# Patient Record
Sex: Female | Born: 1985
Health system: Southern US, Community
[De-identification: ages and names within clinical notes are randomized; demographics above are authoritative.]

## PROBLEM LIST (undated history)

## (undated) DIAGNOSIS — E119 Type 2 diabetes mellitus without complications: Secondary | ICD-10-CM

---

## 2015-01-12 ENCOUNTER — Emergency Department
Admission: EM | Admit: 2015-01-12 | Discharge: 2015-01-12 | Disposition: A | Discharge status: Home / Self Care | Payer: 59 | Source: Home / Self Care | Attending: Family Medicine | Admitting: Family Medicine

## 2015-01-12 ENCOUNTER — Encounter: Payer: Self-pay | Admitting: Emergency Medicine

## 2015-01-12 DIAGNOSIS — W57XXXA Bitten or stung by nonvenomous insect and other nonvenomous arthropods, initial encounter: Secondary | ICD-10-CM | POA: Diagnosis not present

## 2015-01-12 DIAGNOSIS — T148 Other injury of unspecified body region: Secondary | ICD-10-CM | POA: Diagnosis not present

## 2015-01-12 HISTORY — DX: Type 2 diabetes mellitus without complications: E11.9

## 2015-01-12 MED ORDER — DOXYCYCLINE HYCLATE 100 MG PO CAPS: 100.0000 mg | ORAL_CAPSULE | Freq: Two times a day (BID) | ORAL | Status: AC

## 2015-01-12 MED ORDER — DOXYCYCLINE HYCLATE 100 MG PO CAPS: 100.0000 mg | ORAL_CAPSULE | Freq: Two times a day (BID) | ORAL | Status: DC

## 2015-01-12 NOTE — ED Provider Notes (Signed)
CSN: [id number]     Arrival date & time 01/12/15  1127 History   First MD Initiated Contact with Patient 01/12/15 1130     Chief Complaint  Patient presents with  . Insect Bite    HPI Pt presents with chief complaint of insect bite Pt states that he has gotten 2-3 insect bite over past few days Areas red and pruritic No pain  No purulent drainage  No drainage No fever or chills  Baseline diabetic  Sugars mainly in 150s-190s.   Past Medical History  Diagnosis Date  . Diabetes mellitus without complication    Past Surgical History  Procedure Laterality Date  . Cesarean section     Family History  Problem Relation Age of Onset  . Hypertension Father    History  Substance Use Topics  . Smoking status: Never Smoker   . Smokeless tobacco: Not on file  . Alcohol Use: No   OB History    No data available     Review of Systems  All other systems reviewed and are negative.   Allergies  Penicillins  Home Medications   Prior to Admission medications   Medication Sig Start Date End Date Taking? Authorizing Provider  metFORMIN (GLUCOPHAGE) 500 MG tablet Take 500 mg by mouth daily with breakfast.   Yes Historical Provider, MD   BP 148/82 mmHg  Pulse 80  Temp(Src) 97.9 F (36.6 C) (Oral)  Resp 16  Ht 5\' 10"  (1.778 m)  Wt 400 lb (181.439 kg)  BMI 57.39 kg/m2  SpO2 99%  LMP 12/03/2014 (Exact Date) Physical Exam  Constitutional: She appears well-developed and well-nourished.  HENT:  Head: Normocephalic and atraumatic.  Eyes: Conjunctivae are normal. Pupils are equal, round, and reactive to light.  Neck: Neck supple.  Cardiovascular: Normal rate and regular rhythm.   Pulmonary/Chest: Effort normal and breath sounds normal.  Abdominal: Soft.  Musculoskeletal: Normal range of motion.  Neurological: She is alert.  Skin:  2-3 focal circumscribed areas of discrete erythema. No drainage. Minimal skin breakdown.      ED Course  Procedures (including critical care  time) Labs Review Labs Reviewed - No data to display  Imaging Review No results found.   MDM   1. Bug bites    Focal isolated areas.  No tenderness, drainage concerning for infection at present though lower threshold in setting of baseline DM.  Apply topical bactroban/neosporin for now Will call in ppx rx for doxy  Scheduled antihistamines Discussed infectious red flags at length.     The patient and/or caregiver has been counseled thoroughly with regard to treatment plan and/or medications prescribed including dosage, schedule, interactions, rationale for use, and possible side effects and they verbalize understanding. Diagnoses and expected course of recovery discussed and will return if not improved as expected or if the condition worsens. Patient and/or caregiver verbalized understanding.            Floydene Flock, MD 01/12/15 1325

## 2015-01-12 NOTE — ED Notes (Signed)
Reports 3 insect bites: one on upper left arm seems infected; one on left elbow and one on right foot.

## 2015-08-16 DIAGNOSIS — R030 Elevated blood-pressure reading, without diagnosis of hypertension: Secondary | ICD-10-CM | POA: Diagnosis not present

## 2015-08-16 DIAGNOSIS — E119 Type 2 diabetes mellitus without complications: Secondary | ICD-10-CM | POA: Diagnosis not present

## 2015-08-16 MED FILL — glipiZIDE ER 5 MG TB24: 5 | 90 days supply | Qty: 90 | Fill #0

## 2015-08-30 DIAGNOSIS — R05 Cough: Secondary | ICD-10-CM | POA: Diagnosis not present

## 2015-08-30 DIAGNOSIS — J069 Acute upper respiratory infection, unspecified: Secondary | ICD-10-CM | POA: Diagnosis not present

## 2015-09-09 ENCOUNTER — Ambulatory Visit: Payer: 59

## 2015-10-03 ENCOUNTER — Other Ambulatory Visit: Payer: Self-pay

## 2015-10-03 NOTE — Patient Outreach (Signed)
[facility]) Care Management  10/03/2015  Martha Blackwell 1986-08-03 [id number]   Member did not show for scheduled initial Link to Wellness appointment.  Dudley Major RN, [facility] Care Management Coordinator-Link to Wellness [facility] Care Management [phone]

## 2015-10-08 ENCOUNTER — Encounter: Payer: Self-pay | Admitting: Emergency Medicine

## 2015-10-08 ENCOUNTER — Emergency Department (INDEPENDENT_AMBULATORY_CARE_PROVIDER_SITE_OTHER)
Admission: EM | Admit: 2015-10-08 | Discharge: 2015-10-08 | Disposition: A | Discharge status: Hospice | Payer: 59 | Source: Home / Self Care | Attending: Family Medicine | Admitting: Family Medicine

## 2015-10-08 ENCOUNTER — Emergency Department (HOSPITAL_BASED_OUTPATIENT_CLINIC_OR_DEPARTMENT_OTHER)
Admission: EM | Admit: 2015-10-08 | Discharge: 2015-10-08 | Disposition: A | Discharge status: Home / Self Care | Payer: 59 | Attending: Emergency Medicine | Admitting: Emergency Medicine

## 2015-10-08 ENCOUNTER — Encounter (HOSPITAL_BASED_OUTPATIENT_CLINIC_OR_DEPARTMENT_OTHER): Payer: Self-pay | Admitting: *Deleted

## 2015-10-08 ENCOUNTER — Emergency Department (HOSPITAL_BASED_OUTPATIENT_CLINIC_OR_DEPARTMENT_OTHER): Payer: 59

## 2015-10-08 DIAGNOSIS — M1990 Unspecified osteoarthritis, unspecified site: Secondary | ICD-10-CM | POA: Insufficient documentation

## 2015-10-08 DIAGNOSIS — Z3202 Encounter for pregnancy test, result negative: Secondary | ICD-10-CM | POA: Insufficient documentation

## 2015-10-08 DIAGNOSIS — E119 Type 2 diabetes mellitus without complications: Secondary | ICD-10-CM | POA: Insufficient documentation

## 2015-10-08 DIAGNOSIS — M79604 Pain in right leg: Secondary | ICD-10-CM | POA: Diagnosis not present

## 2015-10-08 DIAGNOSIS — M7989 Other specified soft tissue disorders: Secondary | ICD-10-CM | POA: Diagnosis not present

## 2015-10-08 DIAGNOSIS — Z880 Allergy status to penicillin: Secondary | ICD-10-CM | POA: Diagnosis not present

## 2015-10-08 DIAGNOSIS — Z791 Long term (current) use of non-steroidal anti-inflammatories (NSAID): Secondary | ICD-10-CM | POA: Insufficient documentation

## 2015-10-08 DIAGNOSIS — E669 Obesity, unspecified: Secondary | ICD-10-CM | POA: Diagnosis not present

## 2015-10-08 DIAGNOSIS — Z7984 Long term (current) use of oral hypoglycemic drugs: Secondary | ICD-10-CM | POA: Diagnosis not present

## 2015-10-08 DIAGNOSIS — M25571 Pain in right ankle and joints of right foot: Secondary | ICD-10-CM | POA: Diagnosis present

## 2015-10-08 DIAGNOSIS — M79601 Pain in right arm: Secondary | ICD-10-CM

## 2015-10-08 LAB — PREGNANCY, URINE: Preg Test, Ur: NEGATIVE

## 2015-10-08 MED ORDER — DOXYCYCLINE HYCLATE 100 MG PO CAPS: 100.0000 mg | ORAL_CAPSULE | Freq: Two times a day (BID) | ORAL | Status: AC

## 2015-10-08 MED ORDER — NAPROXEN 500 MG PO TABS: 500.0000 mg | ORAL_TABLET | Freq: Two times a day (BID) | ORAL | Status: AC

## 2015-10-08 NOTE — Discharge Instructions (Signed)

## 2015-10-08 NOTE — ED Notes (Signed)
Pt c/o rt ankle pain and swelling that began on Thursday, pt denies any mechanism of injury. CMS intact

## 2015-10-08 NOTE — Discharge Instructions (Signed)

## 2015-10-08 NOTE — ED Notes (Signed)
PT C/O RIGHT ANKLE SWELLING X 3 DAYS, NO APPARENT INJURY

## 2015-10-08 NOTE — ED Provider Notes (Signed)
CSN: [id number]     Arrival date & time 10/08/15  1444 History   First MD Initiated Contact with Patient 10/08/15 1535     Chief Complaint  Patient presents with  . Joint Swelling      HPI Comments: Four days ago patient developed soreness in her right foot.  The pain has now increased to include swelling/pain in her right foot, ankle, and distal lower leg.  She has pain with weight-bearing.  She recalls no injury or change in physical activities.  No chest pain or shortness of breath.  No past history of DVT. Her pain has not responded to "[employer] and Aleve.  Patient is a 30 y.o. female presenting with leg pain. The history is provided by the patient.  Leg Pain Location:  Leg, ankle and foot Time since incident:  4 days Injury: no   Leg location:  R lower leg Ankle location:  R ankle Foot location:  R foot Pain details:    Quality:  Aching   Radiates to:  Does not radiate   Severity:  Moderate   Onset quality:  Gradual   Duration:  4 days   Timing:  Constant   Progression:  Worsening Chronicity:  New Prior injury to area:  No Relieved by:  Nothing Worsened by:  Bearing weight Ineffective treatments:  NSAIDs Associated symptoms: decreased ROM, stiffness and swelling   Associated symptoms: no fatigue, no fever, no muscle weakness, no numbness and no tingling   Risk factors: obesity     Past Medical History  Diagnosis Date  . Diabetes mellitus without complication [facility])    Past Surgical History  Procedure Laterality Date  . Cesarean section     Family History  Problem Relation Age of Onset  . Hypertension Father    Social History  Substance Use Topics  . Smoking status: Never Smoker   . Smokeless tobacco: None  . Alcohol Use: No   OB History    No data available     Review of Systems  Constitutional: Negative for fever and fatigue.  Respiratory: Negative for cough, chest tightness, shortness of breath and wheezing.   Cardiovascular: Positive for leg  swelling. Negative for chest pain.  Musculoskeletal: Positive for stiffness.  All other systems reviewed and are negative.   Allergies  Penicillins and Tuna  Home Medications   Prior to Admission medications   Medication Sig Start Date End Date Taking? Authorizing Provider  glipiZIDE (GLUCOTROL) 5 MG tablet Take by mouth daily before breakfast.   Yes Historical Provider, MD   Meds Ordered and Administered this Visit  Medications - No data to display  BP 141/81 mmHg  Pulse 86  Temp(Src) 98.3 F (36.8 C)  Ht  (1.778 m)  Wt 407 lb 12 oz (184.954 kg)  BMI 58.51 kg/m2  SpO2 98%  LMP 09/04/2015 No data found.   Physical Exam  Constitutional: She is oriented to person, place, and time.  Patient is obese (BMI 58.5)  HENT:  Head: Normocephalic.  Eyes: Pupils are equal, round, and reactive to light.  Pulmonary/Chest: No respiratory distress.  Musculoskeletal:       Right lower leg: She exhibits tenderness and swelling. She exhibits no bony tenderness.       Legs: Right ankle and foot are slightly warm, swollen, and tender to palpation.  Tenderness extends to right distal calf.    Pedal pulses intact.  Neurological: She is alert and oriented to person, place, and time.  Skin:  Skin is warm and dry. No rash noted.  Nursing note and vitals reviewed.   ED Course  Procedures none    MDM   1. Leg pain, inferior, right; Concern for possible DVT   [facility] for further evaluation and  LE venous US to R/O DVT  Lattie HawStephen A Ashwini Jago, MD 10/08/15 [phone]

## 2015-10-08 NOTE — ED Provider Notes (Signed)
CSN: [id number]     Arrival date & time 10/08/15  1608 History  By signing my name below, I, Tanda RockersMargaux Venter, attest that this documentation has been prepared under the direction and in the presence of Rolan BuccoMelanie Jewell Ryans, MD. Electronically Signed: Tanda RockersMargaux Venter, ED Scribe. 10/08/2015. 4:52 PM.   Chief Complaint  Patient presents with  . Ankle Pain   The history is provided by the patient. No language interpreter was used.     HPI Comments: Martha Blackwell is a 30 y.o. female who presents to the Emergency Department complaining of right ankle pain radiating up to right lower leg x 4 days. No known injury or wounds to the ankle. Pt also complains of swelling to the area. She has been taking Aleve with some relief.  [place] and advised to come to the ED for further evaluation to rule out DVT. Denies fever, chest pain, shortness of breath, or any other associated symptoms.   Past Medical History  Diagnosis Date  . Diabetes mellitus without complication [facility](HCC)    Past Surgical History  Procedure Laterality Date  . Cesarean section     Family History  Problem Relation Age of Onset  . Hypertension Father    Social History  Substance Use Topics  . Smoking status: Never Smoker   . Smokeless tobacco: None  . Alcohol Use: No   OB History    No data available     Review of Systems  Constitutional: Negative for fever, chills, diaphoresis and fatigue.  HENT: Negative for congestion, rhinorrhea and sneezing.   Eyes: Negative.   Respiratory: Negative for cough, chest tightness and shortness of breath.   Cardiovascular: Negative for chest pain and leg swelling.  Gastrointestinal: Negative for nausea, vomiting, abdominal pain, diarrhea and blood in stool.  Genitourinary: Negative for frequency, hematuria, flank pain and difficulty urinating.  Musculoskeletal: Positive for joint swelling and arthralgias. Negative for back pain.  Skin: Negative for  rash and wound.  Neurological: Negative for dizziness, speech difficulty, weakness, numbness and headaches.   Allergies  Penicillins and Tuna  Home Medications   Prior to Admission medications   Medication Sig Start Date End Date Taking? Authorizing Provider  doxycycline (VIBRAMYCIN) 100 MG capsule Take 1 capsule (100 mg total) by mouth 2 (two) times daily. One po bid x 7 days 10/08/15   Rolan BuccoMelanie Saumya Hukill, MD  glipiZIDE (GLUCOTROL) 5 MG tablet Take by mouth daily before breakfast.    Historical Provider, MD  naproxen (NAPROSYN) 500 MG tablet Take 1 tablet (500 mg total) by mouth 2 (two) times daily. 10/08/15   Rolan BuccoMelanie Kingsly Kloepfer, MD   BP 142/93 mmHg  Pulse 82  Temp(Src) 98.4 F (36.9 C) (Oral)  Resp 18  Ht 5\' 10"  (1.778 m)  Wt 407 lb (184.614 kg)  BMI 58.40 kg/m2  SpO2 100%  LMP 09/04/2015   Physical Exam  Constitutional: She is oriented to person, place, and time. She appears well-developed and well-nourished.  Obese  HENT:  Head: Normocephalic and atraumatic.  Eyes: Pupils are equal, round, and reactive to light.  Neck: Normal range of motion. Neck supple.  Cardiovascular: Normal rate, regular rhythm and normal heart sounds.   Pulmonary/Chest: Effort normal and breath sounds normal. No respiratory distress. She has no wheezes. She has no rales. She exhibits no tenderness.  Abdominal: Soft. Bowel sounds are normal. There is no tenderness. There is no rebound and no guarding.  Musculoskeletal: Normal range of motion.  She exhibits no edema.  Patient has swelling to the right lower leg from the mid tib-fib area across the ankle and through the dorsum of the foot. There is generalized tenderness on palpation of the area. There is no significant pain on range of motion of the joint which would be more indicative of septic arthritis. There some mild erythema and warmth on the lateral aspect of the ankle. No wounds are noted. She's neurovascularly intact.  Lymphadenopathy:    She has no cervical  adenopathy.  Neurological: She is alert and oriented to person, place, and time.  Skin: Skin is warm and dry. No rash noted.  Psychiatric: She has a normal mood and affect.    ED Course  Procedures (including critical care time)  DIAGNOSTIC STUDIES: Oxygen Saturation is 100% on RA, normal by my interpretation.    COORDINATION OF CARE: 4:51 PM-Discussed treatment plan which includes [place] lower extremity with pt at bedside and pt agreed to plan.   Labs Review Labs Reviewed  PREGNANCY, URINE    Imaging Review Dg Ankle Complete Right  10/08/2015  CLINICAL DATA:  Right ankle pain and swelling for 3 days. No known injury. Diabetes. EXAM: RIGHT ANKLE - COMPLETE 3+ VIEW COMPARISON:  None. FINDINGS: There is no evidence of fracture, dislocation, or joint effusion. Mild degenerative spurring is seen without significant joint space narrowing. No other bone lesions identified. Diffuse soft tissue swelling noted. IMPRESSION: Diffuse ankle soft tissue swelling. No evidence of fracture or dislocation. Mild ankle DJD. Electronically Signed   By: Myles Rosenthal M.D.   On: 10/08/2015 17:54   US Venous Img Lower Unilateral Right  10/08/2015  CLINICAL DATA:  Right leg pain and swelling for 4 days EXAM: RIGHT LOWER EXTREMITY VENOUS DOPPLER ULTRASOUND TECHNIQUE: Gray-scale sonography with graded compression, as well as color Doppler and duplex ultrasound were performed to evaluate the lower extremity deep venous systems from the level of the common femoral vein and including the common femoral, femoral, profunda femoral, popliteal and calf veins including the posterior tibial, peroneal and gastrocnemius veins when visible. The superficial great saphenous vein was also interrogated. Spectral Doppler was utilized to evaluate flow at rest and with distal augmentation maneuvers in the common femoral, femoral and popliteal veins. COMPARISON:  None. FINDINGS: Technically suboptimal exam due to morbid obesity. Contralateral  Common Femoral Vein: Respiratory phasicity is normal and symmetric with the symptomatic side. No evidence of thrombus. Normal compressibility. Common Femoral Vein: No evidence of thrombus. Normal compressibility, respiratory phasicity and response to augmentation. Saphenofemoral Junction: No evidence of thrombus. Normal compressibility and flow on color Doppler imaging. Profunda Femoral Vein: No evidence of thrombus. Normal compressibility and flow on color Doppler imaging. Femoral Vein: No evidence of thrombus. Normal compressibility, respiratory phasicity and response to augmentation. Popliteal Vein: No evidence of thrombus. Normal compressibility, respiratory phasicity and response to augmentation. Calf Veins: No evidence of thrombus. Normal compressibility and flow on color Doppler imaging. Superficial Great Saphenous Vein: No evidence of thrombus. Normal compressibility and flow on color Doppler imaging. Venous Reflux:  None. Other Findings: Soft tissue edema noted in right lower leg/ankle region. IMPRESSION: No definite evidence of acute deep venous thrombosis. Electronically Signed   By: Myles Rosenthal M.D.   On: 10/08/2015 18:47   I have personally reviewed and evaluated these images as part of my medical decision-making.   EKG Interpretation None      MDM   Final diagnoses:  Arthritis   Patient has diffuse swelling of the lower  leg from the mid tib-fib area through the foot. There is no evidence of DVT. There is no bony abnormality. She denies any recent traumatic event. There is no suggestions of a septic joint. She does have some mild erythema and warmth laterally but the rest of the area does not appear to be warm or red. I feel this is more a generalized swelling related to tendinitis/arthritis. She doesn't have a history or family history of gout. She has no other history of joint diseases. Given that she is a diabetic and there is some mild warmth and erythema, I will go ahead and treat her  with antibiotics. She was given prescription for doxycycline and Naprosyn. She was encouraged to keep her leg elevated as much as possible. She was encouraged to follow-up with her PCP within the next few days for recheck or return here as needed for any worsening symptoms. She states her last menstrual cycle was the end of January. Her pregnancy test is negative.  I personally performed the services described in this documentation, which was scribed in my presence.  The recorded information has been reviewed and considered.      Rolan Bucco, MD 10/08/15 2014

## 2015-10-09 MED FILL — DOXYCYCLINE HYC 100 MG CAP: 100 | 7 days supply | Qty: 14 | Fill #0

## 2015-10-09 MED FILL — NAPROXEN 500 MG TABLET: 500 | 15 days supply | Qty: 30 | Fill #0

## 2015-11-21 DIAGNOSIS — I10 Essential (primary) hypertension: Secondary | ICD-10-CM | POA: Diagnosis not present

## 2015-11-21 DIAGNOSIS — E119 Type 2 diabetes mellitus without complications: Secondary | ICD-10-CM | POA: Diagnosis not present

## 2015-11-21 DIAGNOSIS — R635 Abnormal weight gain: Secondary | ICD-10-CM | POA: Diagnosis not present

## 2015-12-08 DIAGNOSIS — Z6841 Body Mass Index (BMI) 40.0 and over, adult: Secondary | ICD-10-CM | POA: Diagnosis not present

## 2015-12-08 DIAGNOSIS — E119 Type 2 diabetes mellitus without complications: Secondary | ICD-10-CM | POA: Diagnosis not present

## 2015-12-22 DIAGNOSIS — Z6841 Body Mass Index (BMI) 40.0 and over, adult: Secondary | ICD-10-CM | POA: Diagnosis not present

## 2015-12-27 DIAGNOSIS — Z01419 Encounter for gynecological examination (general) (routine) without abnormal findings: Secondary | ICD-10-CM | POA: Diagnosis not present

## 2015-12-27 MED FILL — MEDROXYPROGESTERONE 10 MG T: 10 | 30 days supply | Qty: 30 | Fill #0

## 2016-01-26 DIAGNOSIS — Z6841 Body Mass Index (BMI) 40.0 and over, adult: Secondary | ICD-10-CM | POA: Diagnosis not present

## 2016-01-26 DIAGNOSIS — E119 Type 2 diabetes mellitus without complications: Secondary | ICD-10-CM | POA: Diagnosis not present

## 2016-02-16 DIAGNOSIS — F54 Psychological and behavioral factors associated with disorders or diseases classified elsewhere: Secondary | ICD-10-CM | POA: Diagnosis not present

## 2016-02-16 DIAGNOSIS — Z7189 Other specified counseling: Secondary | ICD-10-CM | POA: Diagnosis not present

## 2016-02-16 DIAGNOSIS — Z6841 Body Mass Index (BMI) 40.0 and over, adult: Secondary | ICD-10-CM | POA: Diagnosis not present

## 2016-03-04 DIAGNOSIS — E119 Type 2 diabetes mellitus without complications: Secondary | ICD-10-CM | POA: Diagnosis not present

## 2016-03-04 DIAGNOSIS — Z6841 Body Mass Index (BMI) 40.0 and over, adult: Secondary | ICD-10-CM | POA: Diagnosis not present

## 2016-03-04 MED FILL — glipiZIDE XL 5 MG TB24: 5 | 90 days supply | Qty: 90 | Fill #0

## 2016-03-08 DIAGNOSIS — Z6841 Body Mass Index (BMI) 40.0 and over, adult: Secondary | ICD-10-CM | POA: Diagnosis not present

## 2016-03-08 DIAGNOSIS — E119 Type 2 diabetes mellitus without complications: Secondary | ICD-10-CM | POA: Diagnosis not present

## 2016-03-26 MED FILL — PHENTERMINE 37.5 MG TABLET: 37.5 | 30 days supply | Qty: 30 | Fill #0

## 2016-05-06 MED FILL — PHENTERMINE 37.5 MG TABLET: 37.5 | 30 days supply | Qty: 30 | Fill #1

## 2016-06-11 MED FILL — PHENTERMINE 37.5 MG TABLET: 37.5 | 30 days supply | Qty: 30 | Fill #0

## 2016-06-21 DIAGNOSIS — I10 Essential (primary) hypertension: Secondary | ICD-10-CM | POA: Diagnosis not present

## 2016-06-21 DIAGNOSIS — E119 Type 2 diabetes mellitus without complications: Secondary | ICD-10-CM | POA: Diagnosis not present

## 2016-06-21 DIAGNOSIS — Z6841 Body Mass Index (BMI) 40.0 and over, adult: Secondary | ICD-10-CM | POA: Diagnosis not present

## 2016-07-23 DIAGNOSIS — Z6841 Body Mass Index (BMI) 40.0 and over, adult: Secondary | ICD-10-CM | POA: Diagnosis not present

## 2016-07-23 DIAGNOSIS — E785 Hyperlipidemia, unspecified: Secondary | ICD-10-CM | POA: Diagnosis not present

## 2016-07-23 DIAGNOSIS — E1169 Type 2 diabetes mellitus with other specified complication: Secondary | ICD-10-CM | POA: Diagnosis not present

## 2016-08-23 MED FILL — glipiZIDE XL 5 MG TB24: 5 | 90 days supply | Qty: 90 | Fill #0

## 2016-09-03 MED FILL — PHENTERMINE 37.5 MG TABLET: 37.5 | 30 days supply | Qty: 30 | Fill #0

## 2016-10-24 DIAGNOSIS — Z6841 Body Mass Index (BMI) 40.0 and over, adult: Secondary | ICD-10-CM | POA: Diagnosis not present

## 2016-10-24 DIAGNOSIS — E785 Hyperlipidemia, unspecified: Secondary | ICD-10-CM | POA: Diagnosis not present

## 2016-10-24 DIAGNOSIS — G43009 Migraine without aura, not intractable, without status migrainosus: Secondary | ICD-10-CM | POA: Diagnosis not present

## 2016-10-24 DIAGNOSIS — E1169 Type 2 diabetes mellitus with other specified complication: Secondary | ICD-10-CM | POA: Diagnosis not present

## 2016-10-25 DIAGNOSIS — Z6841 Body Mass Index (BMI) 40.0 and over, adult: Secondary | ICD-10-CM | POA: Diagnosis not present

## 2016-10-25 DIAGNOSIS — E119 Type 2 diabetes mellitus without complications: Secondary | ICD-10-CM | POA: Diagnosis not present

## 2016-11-04 MED FILL — BELVIQ 10 MG TABLET: 10 | 30 days supply | Qty: 60 | Fill #0

## 2016-12-06 DIAGNOSIS — L700 Acne vulgaris: Secondary | ICD-10-CM | POA: Diagnosis not present

## 2016-12-09 MED FILL — ACCU-CHEK FASTCLIX LANCETS: 90 days supply | Qty: 102 | Fill #0

## 2016-12-09 MED FILL — ACCU-CHEK GUIDE TEST STRIP: 90 days supply | Qty: 100 | Fill #0

## 2017-01-03 MED FILL — glipiZIDE XL 5 MG TB24: 5 | 90 days supply | Qty: 90 | Fill #0

## 2017-01-21 DIAGNOSIS — E669 Obesity, unspecified: Secondary | ICD-10-CM | POA: Diagnosis not present

## 2017-01-21 DIAGNOSIS — D649 Anemia, unspecified: Secondary | ICD-10-CM | POA: Diagnosis not present

## 2017-01-21 DIAGNOSIS — E1169 Type 2 diabetes mellitus with other specified complication: Secondary | ICD-10-CM | POA: Diagnosis not present

## 2017-01-31 DIAGNOSIS — E119 Type 2 diabetes mellitus without complications: Secondary | ICD-10-CM | POA: Diagnosis not present

## 2017-01-31 DIAGNOSIS — Z6841 Body Mass Index (BMI) 40.0 and over, adult: Secondary | ICD-10-CM | POA: Diagnosis not present

## 2017-01-31 MED FILL — QSYMIA 3.75 MG-23 MG CAP: 3.75-23 | 14 days supply | Qty: 14 | Fill #0

## 2017-02-28 MED FILL — QSYMIA 7.5 MG-46 MG CAPSULE: 7.5-46 | 30 days supply | Qty: 30 | Fill #0

## 2017-04-11 DIAGNOSIS — Z6841 Body Mass Index (BMI) 40.0 and over, adult: Secondary | ICD-10-CM | POA: Diagnosis not present

## 2017-04-11 DIAGNOSIS — E119 Type 2 diabetes mellitus without complications: Secondary | ICD-10-CM | POA: Diagnosis not present

## 2017-04-21 DIAGNOSIS — E1169 Type 2 diabetes mellitus with other specified complication: Secondary | ICD-10-CM | POA: Diagnosis not present

## 2017-04-21 DIAGNOSIS — E669 Obesity, unspecified: Secondary | ICD-10-CM | POA: Diagnosis not present

## 2017-04-21 DIAGNOSIS — D649 Anemia, unspecified: Secondary | ICD-10-CM | POA: Diagnosis not present

## 2017-05-06 MED FILL — glipiZIDE ER 5 MG TB24: 5 | 90 days supply | Qty: 90 | Fill #0

## 2017-05-06 MED FILL — ACCU-CHEK GUIDE TEST STRIP: 90 days supply | Qty: 100 | Fill #0

## 2017-06-20 DIAGNOSIS — E559 Vitamin D deficiency, unspecified: Secondary | ICD-10-CM | POA: Diagnosis not present

## 2017-06-20 DIAGNOSIS — Z6841 Body Mass Index (BMI) 40.0 and over, adult: Secondary | ICD-10-CM | POA: Diagnosis not present

## 2017-06-20 DIAGNOSIS — Z136 Encounter for screening for cardiovascular disorders: Secondary | ICD-10-CM | POA: Diagnosis not present

## 2017-06-20 DIAGNOSIS — E119 Type 2 diabetes mellitus without complications: Secondary | ICD-10-CM | POA: Diagnosis not present

## 2017-06-20 DIAGNOSIS — R5383 Other fatigue: Secondary | ICD-10-CM | POA: Diagnosis not present

## 2017-08-24 IMAGING — DX DG ANKLE COMPLETE 3+V*R*
3 series · 3 of 3 positions shown · non-contrast
Comparison: None.

CLINICAL DATA: Right ankle pain and swelling for 3 days. No known
injury. Diabetes.

EXAM:
RIGHT ANKLE - COMPLETE 3+ VIEW

[ankle obl]
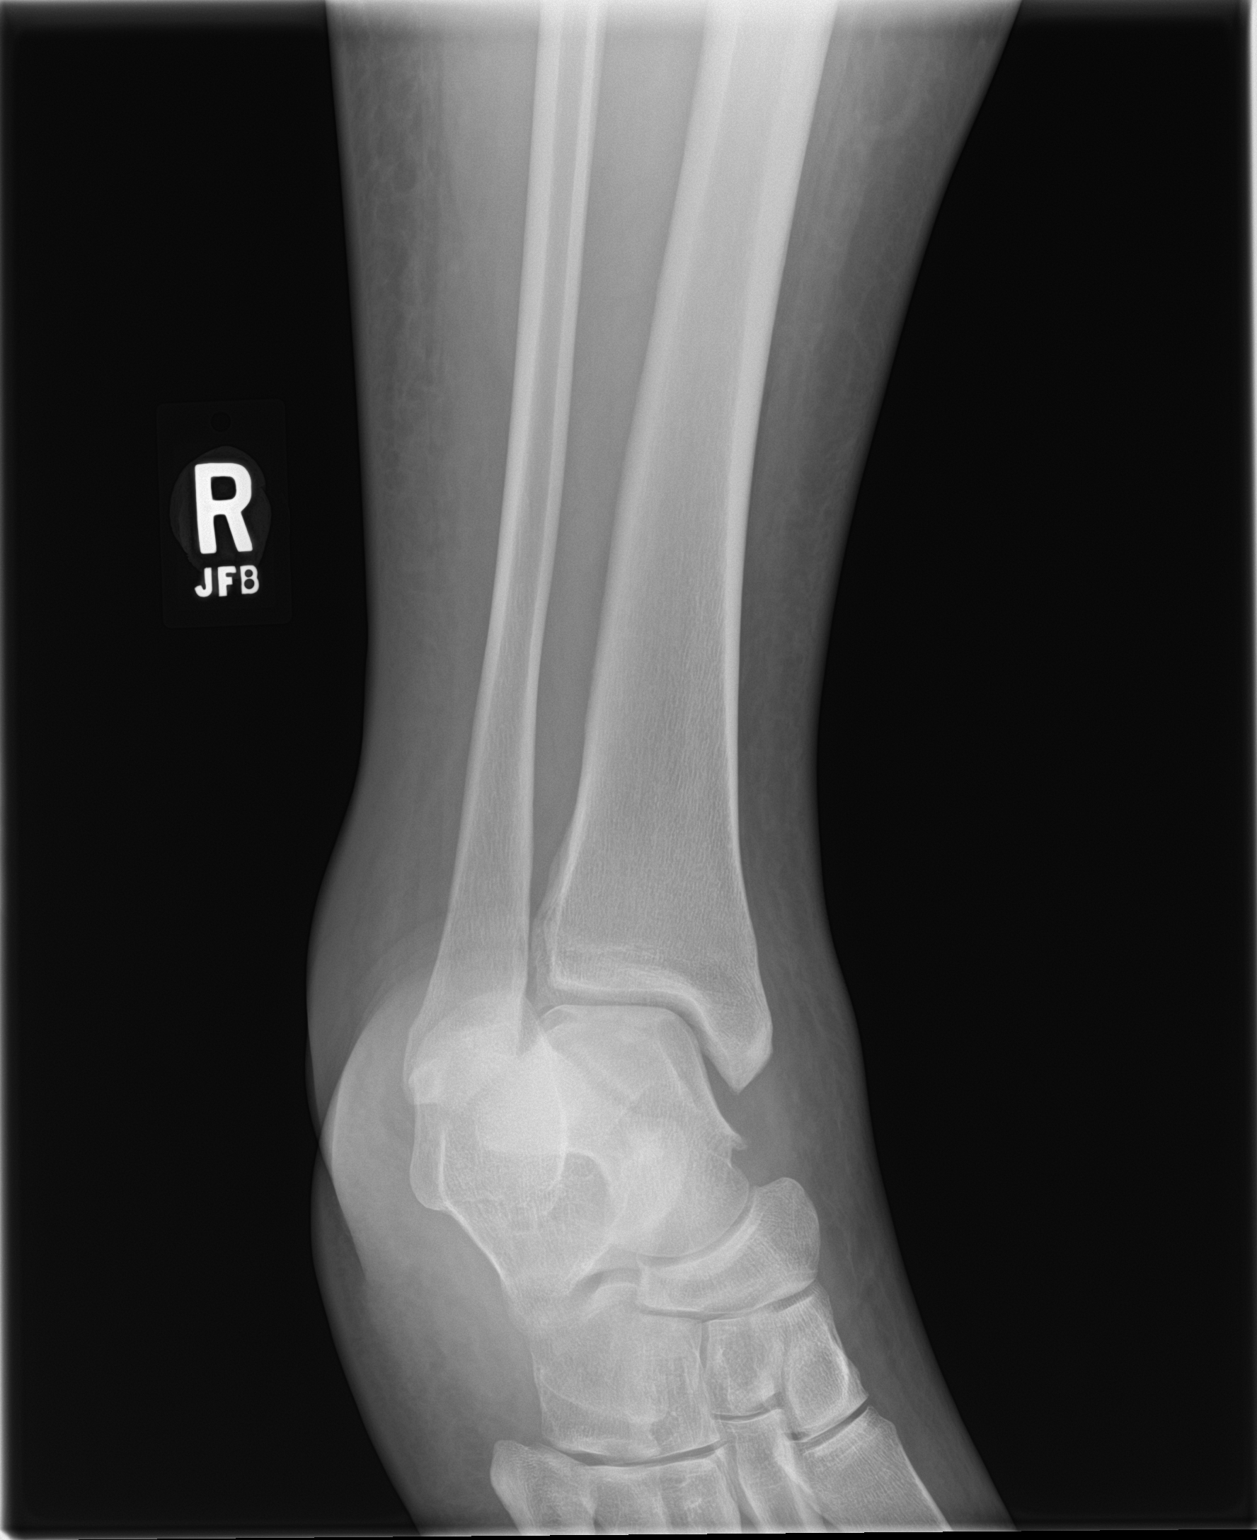

[ankle lat]
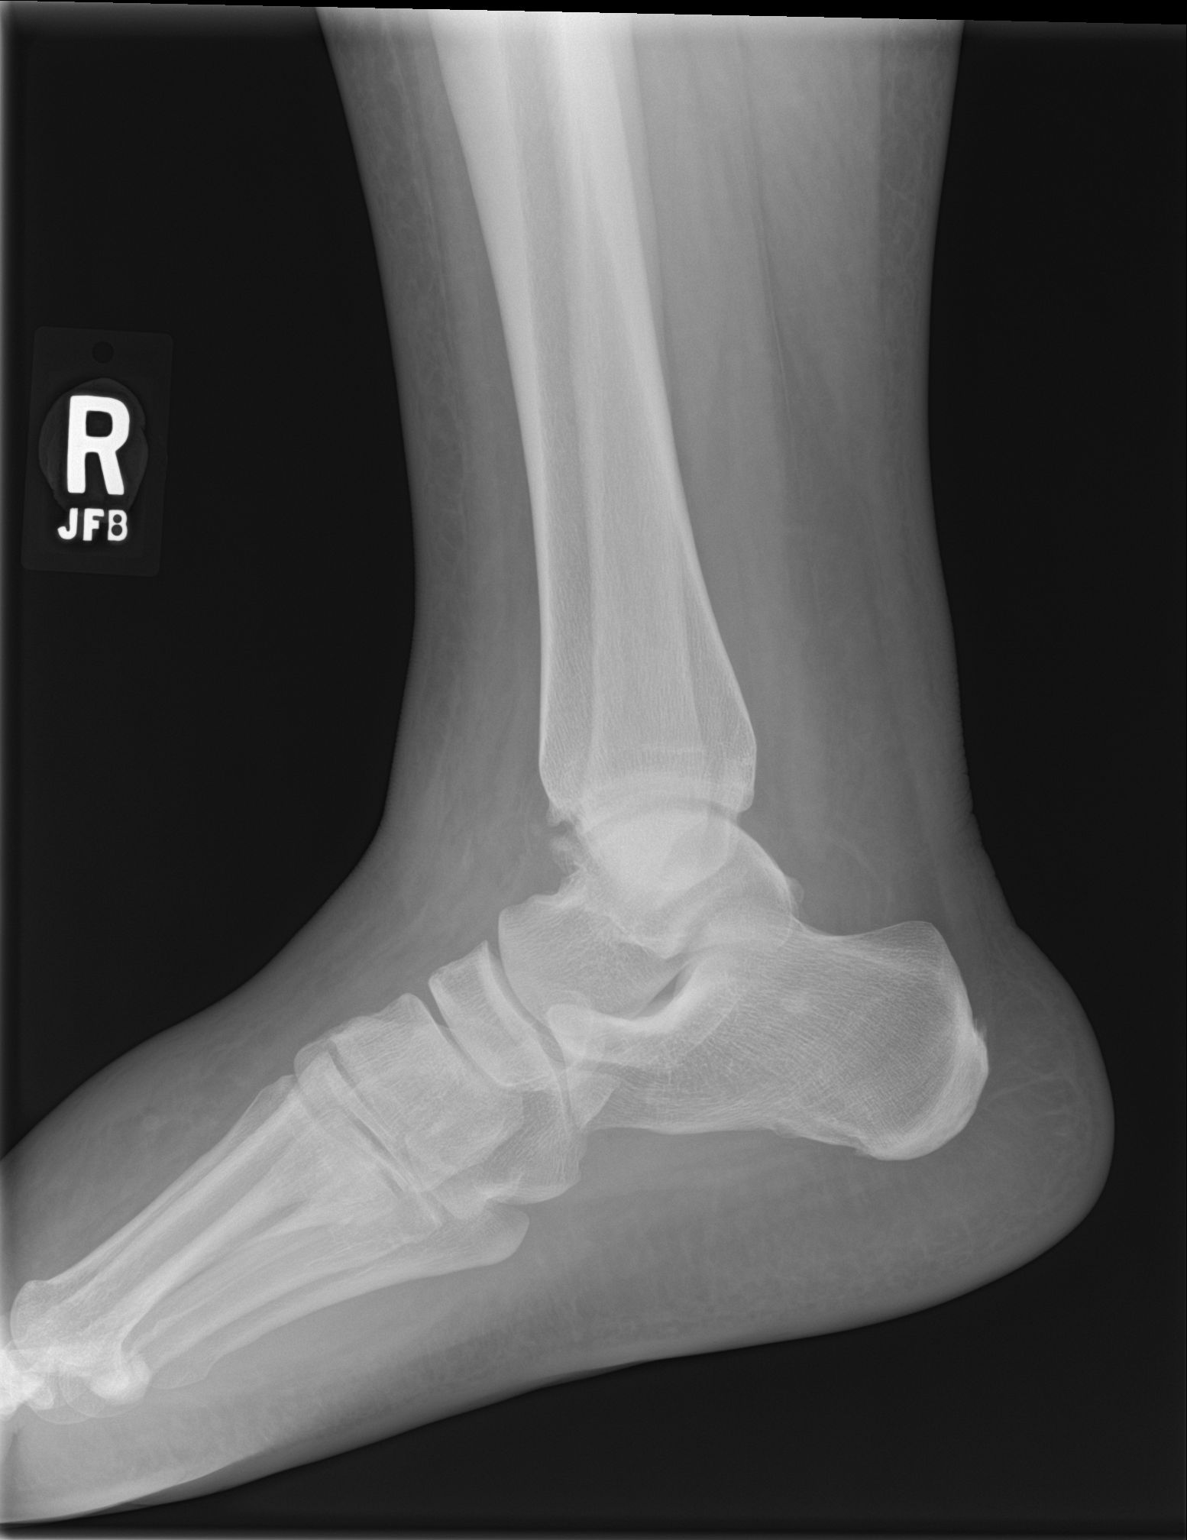

[ankle ap]
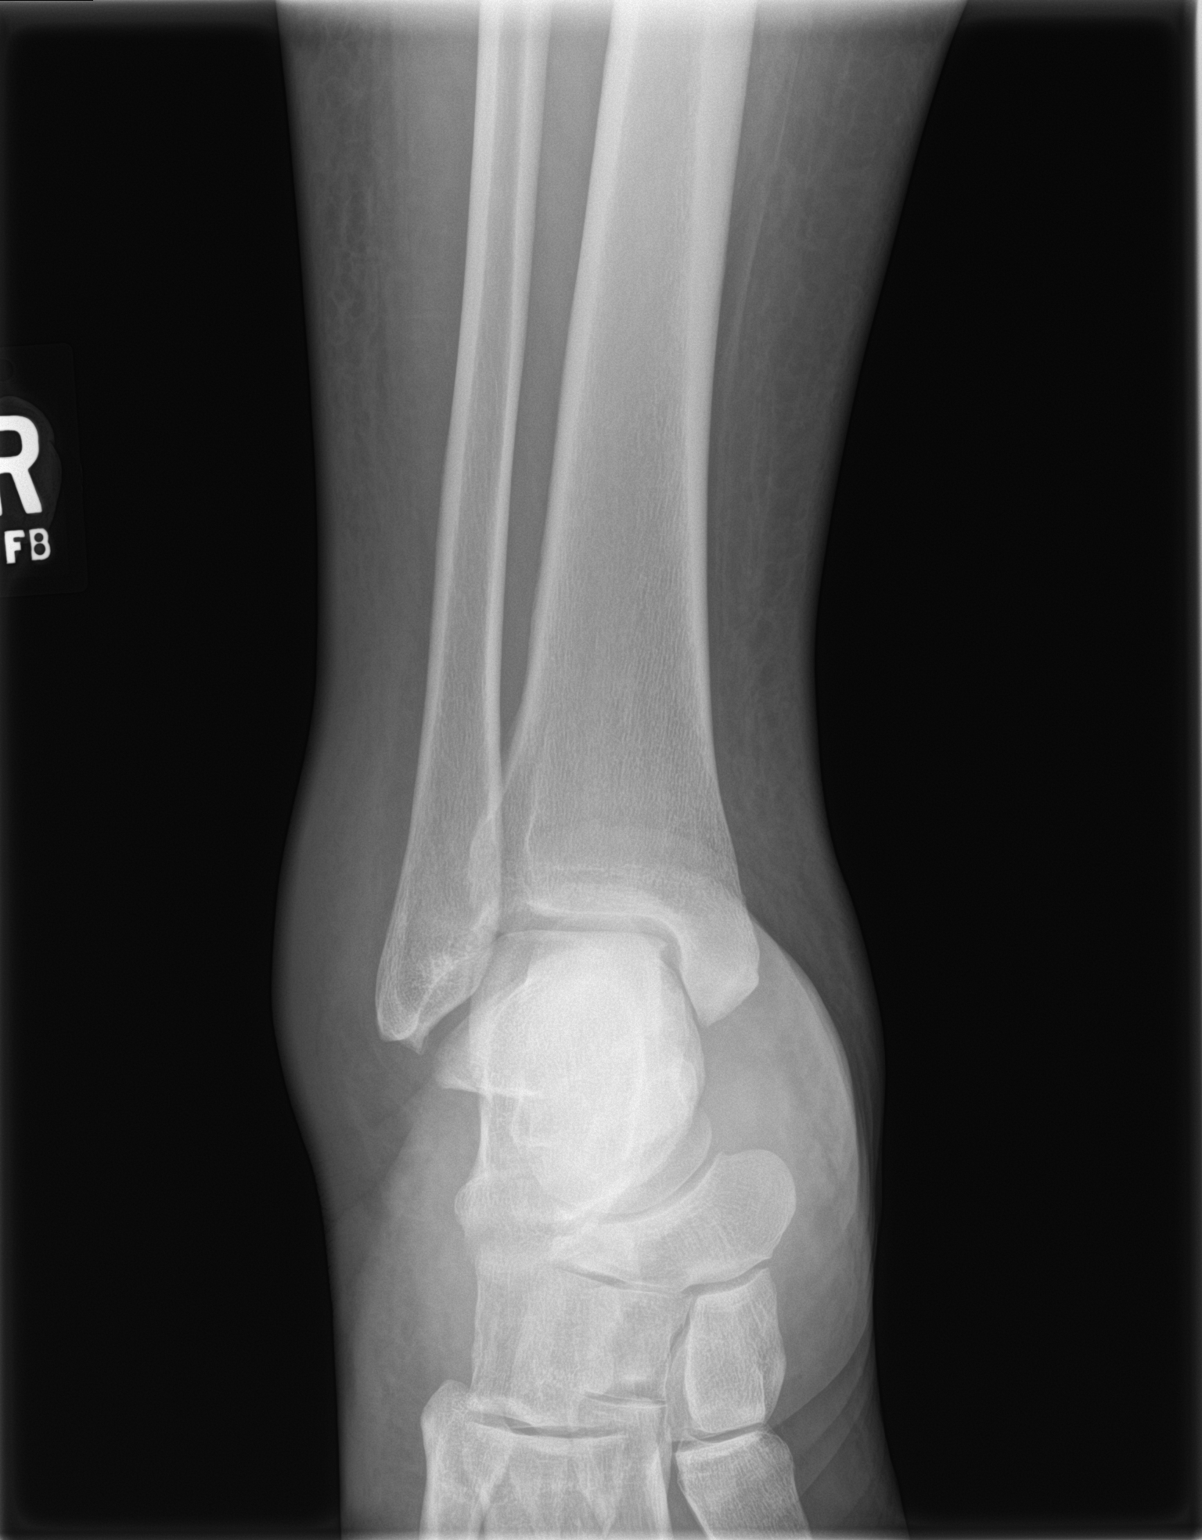

[3 of 3 positions shown; findings below may reference images not displayed]

FINDINGS: There is no evidence of fracture, dislocation, or joint effusion.
Mild degenerative spurring is seen without significant joint space
narrowing. No other bone lesions identified. Diffuse soft tissue
swelling noted.
IMPRESSION: Diffuse ankle soft tissue swelling. No evidence of fracture or
dislocation.

Mild ankle DJD.

## 2018-02-04 IMAGING — US US EXTREM LOW VENOUS*R*
1 series · 13 of 24 positions shown · non-contrast
Comparison: None.

CLINICAL DATA: Right leg pain and swelling for 4 days



[Series 1: us extrem low venous*right* · 0.10mm/px · 13 of 24 slices shown]
[im 1/24]
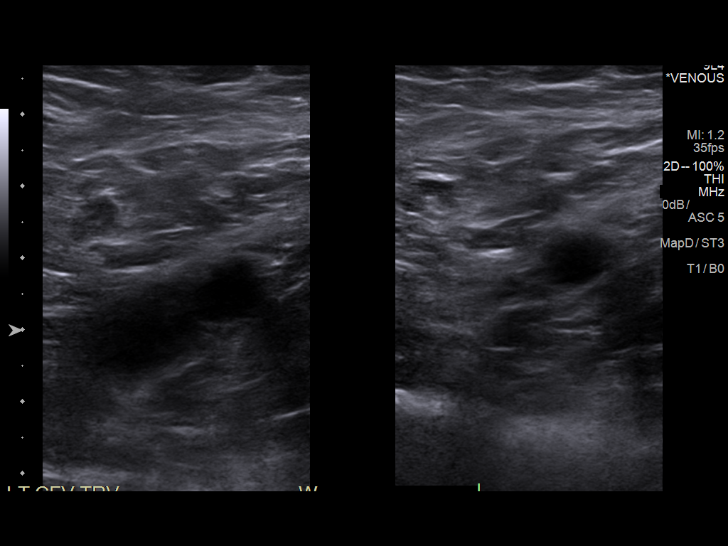
[im 3/24]
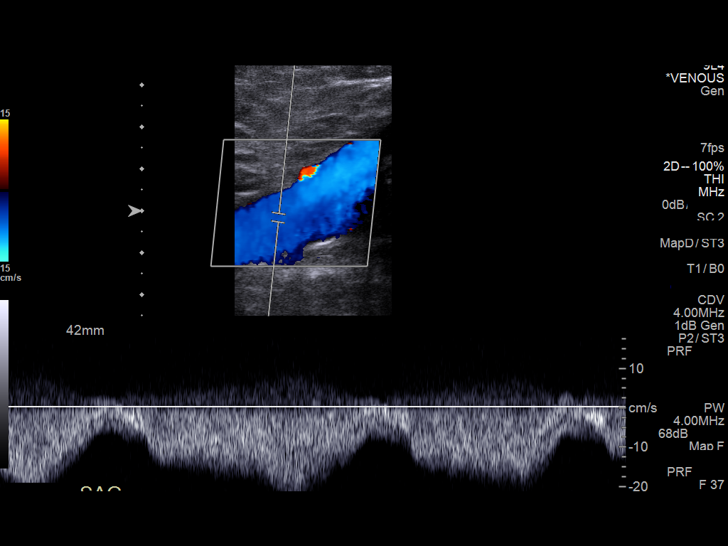
[im 5/24]
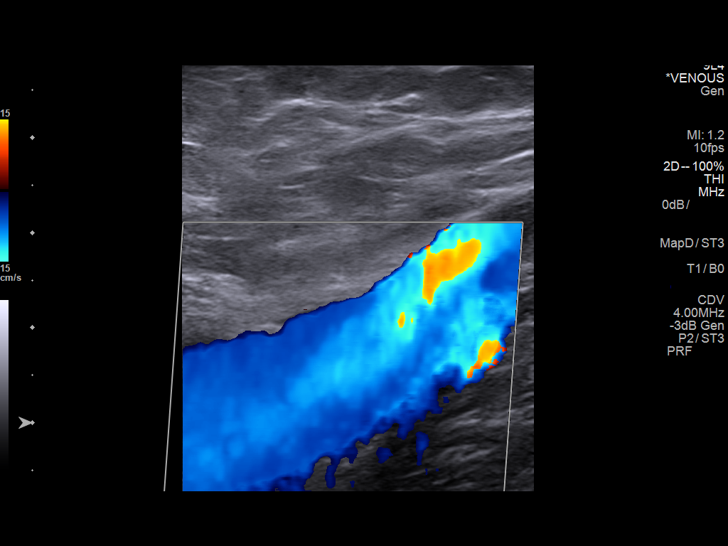
[im 7/24]
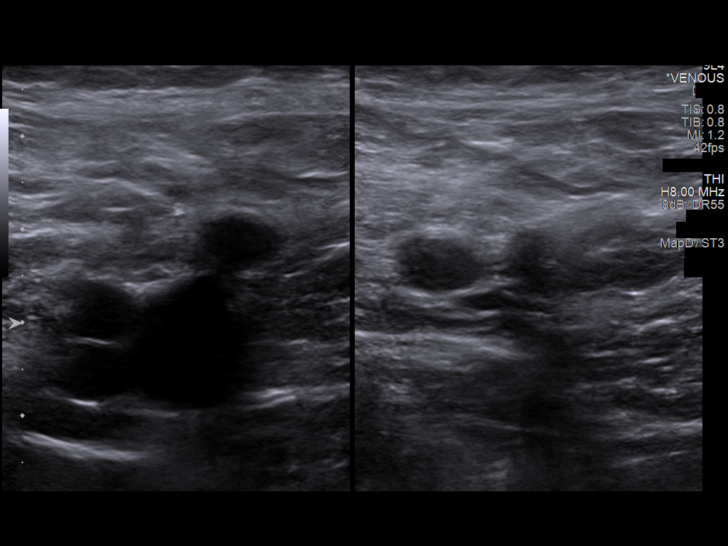
[im 9/24]
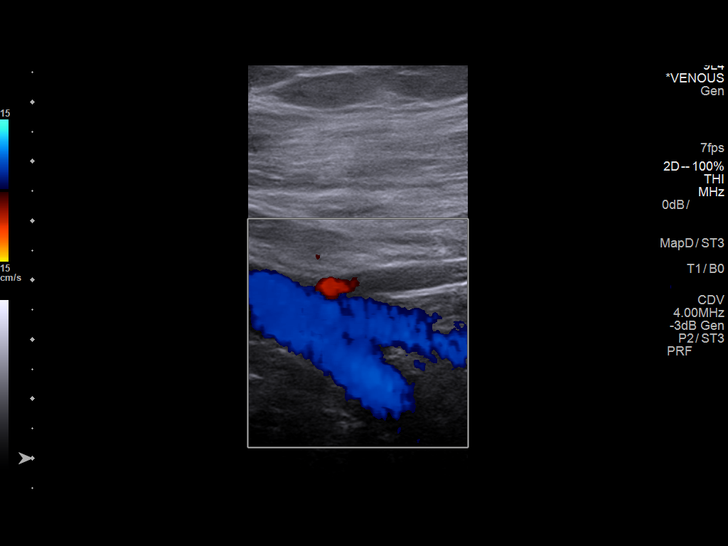
[im 11/24]
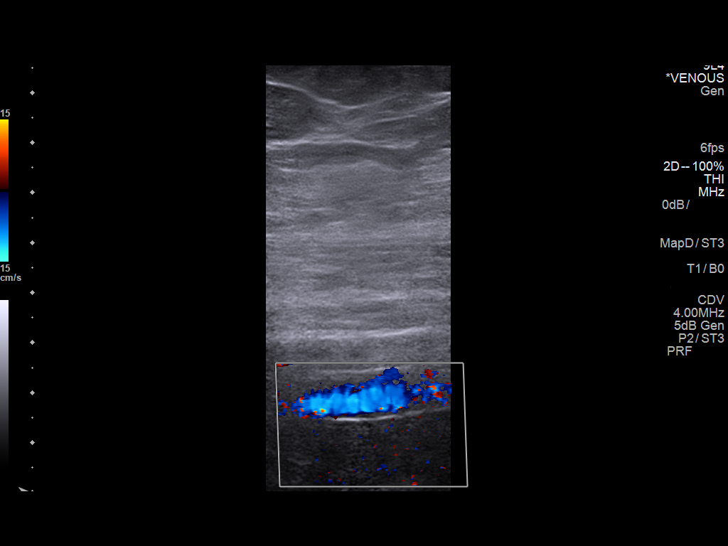
[im 13/24]
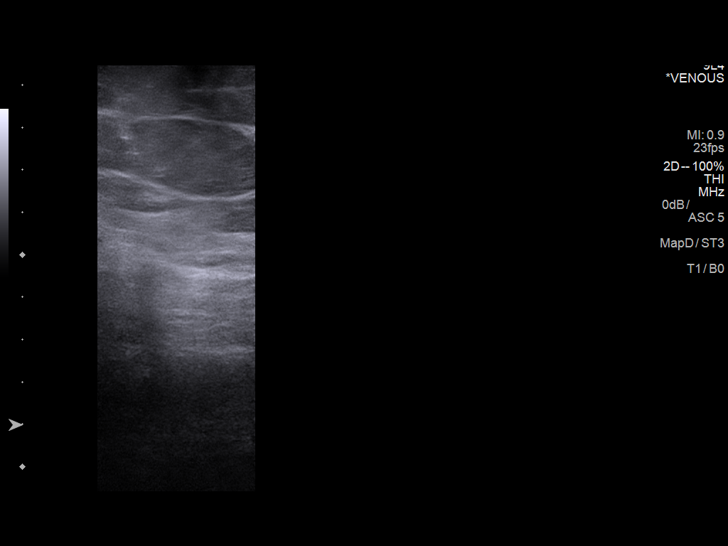
[im 14/24]
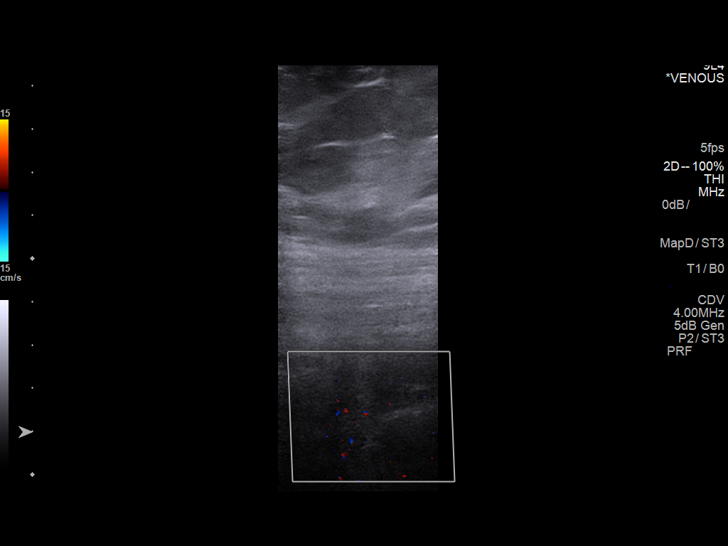
[im 16/24]
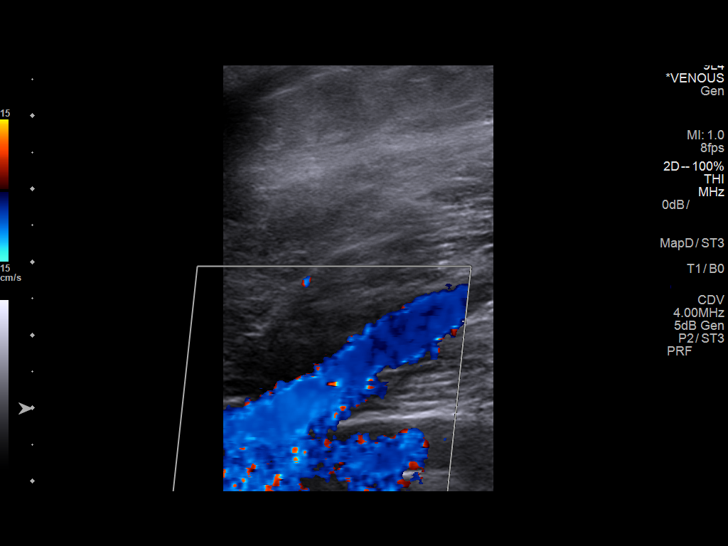
[im 18/24]
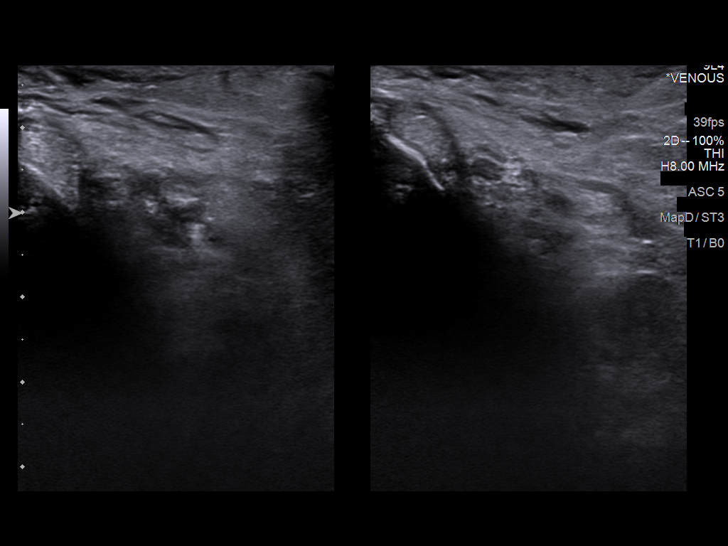
[im 20/24]
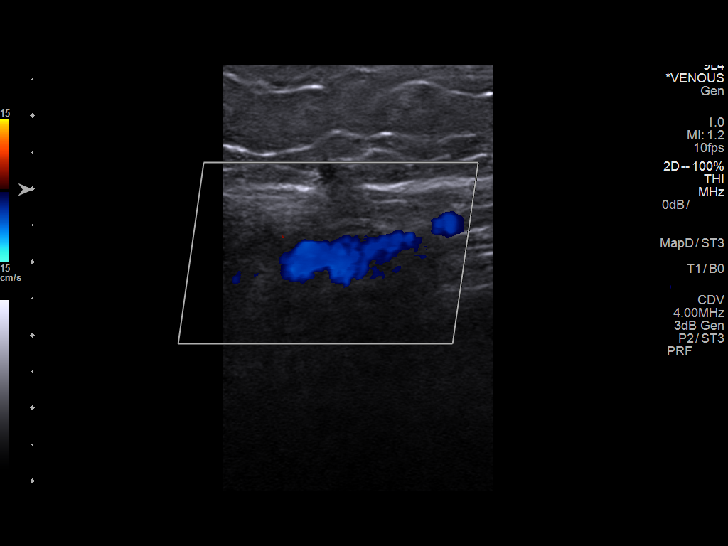
[im 22/24]
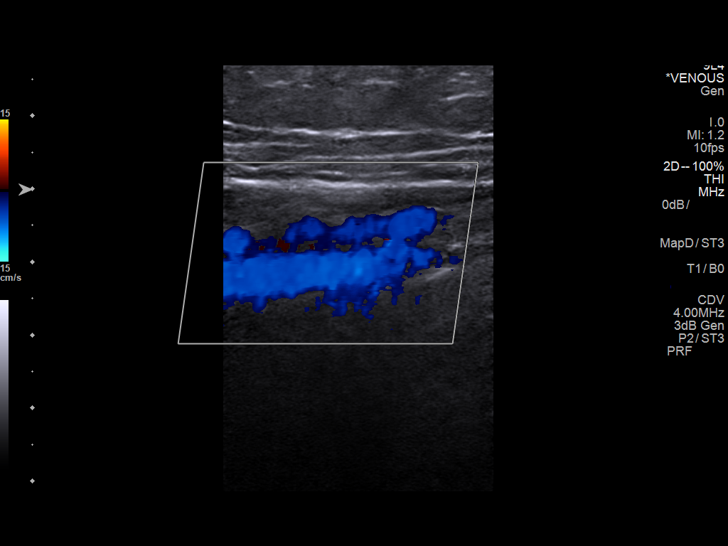
[im 24/24]
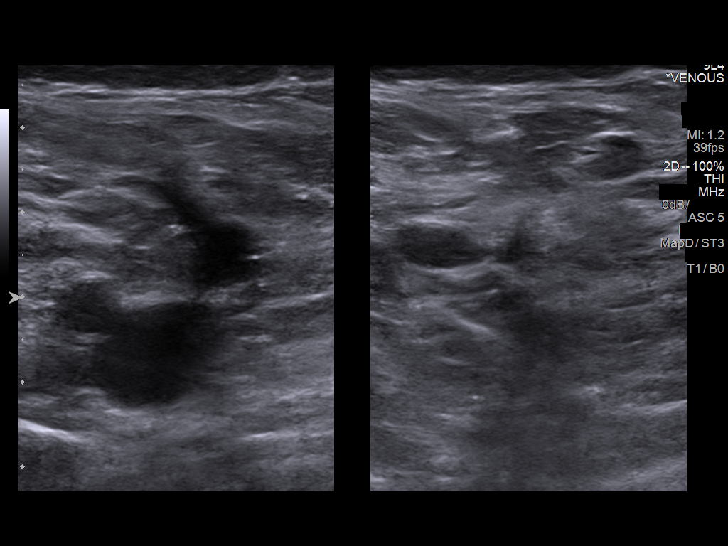

[13 of 24 positions shown; findings below may reference images not displayed]

FINDINGS: Technically suboptimal exam due to morbid obesity.

Contralateral Common Femoral Vein: Respiratory phasicity is normal
and symmetric with the symptomatic side. No evidence of thrombus.
Normal compressibility.

Common Femoral Vein: No evidence of thrombus. Normal
compressibility, respiratory phasicity and response to augmentation.

Saphenofemoral Junction: No evidence of thrombus. Normal
compressibility and flow on color Doppler imaging.

Profunda Femoral Vein: No evidence of thrombus. Normal
compressibility and flow on color Doppler imaging.

Femoral Vein: No evidence of thrombus. Normal compressibility,
respiratory phasicity and response to augmentation.

Popliteal Vein: No evidence of thrombus. Normal compressibility,
respiratory phasicity and response to augmentation.

Calf Veins: No evidence of thrombus. Normal compressibility and flow
on color Doppler imaging.

Superficial Great Saphenous Vein: No evidence of thrombus. Normal
compressibility and flow on color Doppler imaging.

Venous Reflux:  None.

Other Findings: Soft tissue edema noted in right lower leg/ankle
region.
IMPRESSION: No definite evidence of acute deep venous thrombosis.

## 2021-06-23 DIAGNOSIS — M542 Cervicalgia: Secondary | ICD-10-CM | POA: Diagnosis not present

## 2021-08-16 DIAGNOSIS — E611 Iron deficiency: Secondary | ICD-10-CM | POA: Diagnosis not present

## 2021-08-16 DIAGNOSIS — E559 Vitamin D deficiency, unspecified: Secondary | ICD-10-CM | POA: Diagnosis not present

## 2021-08-16 DIAGNOSIS — R7303 Prediabetes: Secondary | ICD-10-CM | POA: Diagnosis not present

## 2021-08-17 DIAGNOSIS — E119 Type 2 diabetes mellitus without complications: Secondary | ICD-10-CM | POA: Diagnosis not present

## 2021-08-17 DIAGNOSIS — E669 Obesity, unspecified: Secondary | ICD-10-CM | POA: Diagnosis not present

## 2021-08-17 DIAGNOSIS — D508 Other iron deficiency anemias: Secondary | ICD-10-CM | POA: Diagnosis not present

## 2021-08-17 DIAGNOSIS — E559 Vitamin D deficiency, unspecified: Secondary | ICD-10-CM | POA: Diagnosis not present

## 2021-08-17 DIAGNOSIS — Z6841 Body Mass Index (BMI) 40.0 and over, adult: Secondary | ICD-10-CM | POA: Diagnosis not present

## 2021-08-17 DIAGNOSIS — G4730 Sleep apnea, unspecified: Secondary | ICD-10-CM | POA: Diagnosis not present

## 2021-09-21 DIAGNOSIS — E119 Type 2 diabetes mellitus without complications: Secondary | ICD-10-CM | POA: Diagnosis not present

## 2021-09-21 DIAGNOSIS — E559 Vitamin D deficiency, unspecified: Secondary | ICD-10-CM | POA: Diagnosis not present

## 2021-09-21 DIAGNOSIS — Z6841 Body Mass Index (BMI) 40.0 and over, adult: Secondary | ICD-10-CM | POA: Diagnosis not present

## 2021-09-21 DIAGNOSIS — E669 Obesity, unspecified: Secondary | ICD-10-CM | POA: Diagnosis not present

## 2021-09-23 DIAGNOSIS — G4730 Sleep apnea, unspecified: Secondary | ICD-10-CM | POA: Diagnosis not present

## 2021-09-23 DIAGNOSIS — E119 Type 2 diabetes mellitus without complications: Secondary | ICD-10-CM | POA: Diagnosis not present

## 2021-09-23 DIAGNOSIS — G478 Other sleep disorders: Secondary | ICD-10-CM | POA: Diagnosis not present

## 2021-09-23 DIAGNOSIS — I10 Essential (primary) hypertension: Secondary | ICD-10-CM | POA: Diagnosis not present

## 2021-09-23 DIAGNOSIS — Z6841 Body Mass Index (BMI) 40.0 and over, adult: Secondary | ICD-10-CM | POA: Diagnosis not present

## 2021-09-23 DIAGNOSIS — R0683 Snoring: Secondary | ICD-10-CM | POA: Diagnosis not present

## 2021-10-08 DIAGNOSIS — G4733 Obstructive sleep apnea (adult) (pediatric): Secondary | ICD-10-CM | POA: Diagnosis not present

## 2021-11-02 DIAGNOSIS — Z6841 Body Mass Index (BMI) 40.0 and over, adult: Secondary | ICD-10-CM | POA: Diagnosis not present

## 2021-11-02 DIAGNOSIS — E669 Obesity, unspecified: Secondary | ICD-10-CM | POA: Diagnosis not present

## 2021-11-02 DIAGNOSIS — E559 Vitamin D deficiency, unspecified: Secondary | ICD-10-CM | POA: Diagnosis not present

## 2021-11-02 DIAGNOSIS — E119 Type 2 diabetes mellitus without complications: Secondary | ICD-10-CM | POA: Diagnosis not present
